# Patient Record
Sex: Male | Born: 1945 | Race: White | Hispanic: No | Marital: Married
Health system: Southern US, Community
[De-identification: ages and names within clinical notes are randomized; demographics above are authoritative.]

---

## 2013-06-26 ENCOUNTER — Other Ambulatory Visit (HOSPITAL_COMMUNITY): Payer: Self-pay

## 2013-06-26 ENCOUNTER — Inpatient Hospital Stay
Admission: AD | Admit: 2013-06-26 | Discharge: 2013-07-17 | Disposition: A | Discharge status: Skilled Nursing Facility | Payer: Self-pay | Source: Ambulatory Visit | Attending: Internal Medicine | Admitting: Internal Medicine

## 2013-06-27 ENCOUNTER — Other Ambulatory Visit (HOSPITAL_COMMUNITY): Payer: Self-pay

## 2013-06-27 LAB — URINALYSIS, ROUTINE W REFLEX MICROSCOPIC
Bilirubin Urine: NEGATIVE
Glucose, UA: NEGATIVE mg/dL
Ketones, ur: NEGATIVE mg/dL
Nitrite: NEGATIVE
Protein, ur: NEGATIVE mg/dL
Specific Gravity, Urine: 1.021 (ref 1.005–1.030)
UROBILINOGEN UA: 0.2 mg/dL (ref 0.0–1.0)
pH: 6.5 (ref 5.0–8.0)

## 2013-06-27 LAB — CBC WITH DIFFERENTIAL/PLATELET
BASOS PCT: 1 % (ref 0–1)
Basophils Absolute: 0 10*3/uL (ref 0.0–0.1)
Eosinophils Absolute: 0.3 10*3/uL (ref 0.0–0.7)
Eosinophils Relative: 6 % — ABNORMAL HIGH (ref 0–5)
HCT: 27.7 % — ABNORMAL LOW (ref 39.0–52.0)
Hemoglobin: 8.6 g/dL — ABNORMAL LOW (ref 13.0–17.0)
LYMPHS ABS: 1.1 10*3/uL (ref 0.7–4.0)
Lymphocytes Relative: 21 % (ref 12–46)
MCH: 29.7 pg (ref 26.0–34.0)
MCHC: 31 g/dL (ref 30.0–36.0)
MCV: 95.5 fL (ref 78.0–100.0)
Monocytes Absolute: 0.4 10*3/uL (ref 0.1–1.0)
Monocytes Relative: 7 % (ref 3–12)
NEUTROS PCT: 66 % (ref 43–77)
Neutro Abs: 3.5 10*3/uL (ref 1.7–7.7)
PLATELETS: 227 10*3/uL (ref 150–400)
RBC: 2.9 MIL/uL — AB (ref 4.22–5.81)
RDW: 15.3 % (ref 11.5–15.5)
WBC: 5.4 10*3/uL (ref 4.0–10.5)

## 2013-06-27 LAB — HEMOGLOBIN A1C
Hgb A1c MFr Bld: 5.2 % (ref <5.7)
MEAN PLASMA GLUCOSE: 103 mg/dL (ref <117)

## 2013-06-27 LAB — TSH: TSH: 0.995 u[IU]/mL (ref 0.350–4.500)

## 2013-06-27 LAB — COMPREHENSIVE METABOLIC PANEL
ALK PHOS: 111 U/L (ref 39–117)
ALT: 45 U/L (ref 0–53)
AST: 36 U/L (ref 0–37)
Albumin: 2.1 g/dL — ABNORMAL LOW (ref 3.5–5.2)
BUN: 31 mg/dL — ABNORMAL HIGH (ref 6–23)
CO2: 30 mEq/L (ref 19–32)
Calcium: 9.2 mg/dL (ref 8.4–10.5)
Chloride: 106 mEq/L (ref 96–112)
Creatinine, Ser: 0.64 mg/dL (ref 0.50–1.35)
GFR calc Af Amer: >90 mL/min (ref >90)
GFR calc non Af Amer: >90 mL/min (ref >90)
GLUCOSE: 98 mg/dL (ref 70–99)
POTASSIUM: 4.2 meq/L (ref 3.7–5.3)
SODIUM: 145 meq/L (ref 137–147)
TOTAL PROTEIN: 7.1 g/dL (ref 6.0–8.3)
Total Bilirubin: 0.5 mg/dL (ref 0.3–1.2)

## 2013-06-27 LAB — URINE MICROSCOPIC-ADD ON

## 2013-06-27 LAB — PROTIME-INR
INR: 1.26 (ref 0.00–1.49)
Prothrombin Time: 15.5 seconds — ABNORMAL HIGH (ref 11.6–15.2)

## 2013-06-27 LAB — LIPID PANEL
CHOL/HDL RATIO: 4.8 ratio
Cholesterol: 100 mg/dL (ref 0–200)
HDL: 21 mg/dL — ABNORMAL LOW (ref >39)
LDL Cholesterol: 66 mg/dL (ref 0–99)
Triglycerides: 63 mg/dL (ref <150)
VLDL: 13 mg/dL (ref 0–40)

## 2013-06-27 LAB — MAGNESIUM: Magnesium: 2.1 mg/dL (ref 1.5–2.5)

## 2013-06-27 LAB — PROCALCITONIN: PROCALCITONIN: 0.13 ng/mL

## 2013-06-27 LAB — APTT: APTT: 50 s — AB (ref 24–37)

## 2013-06-27 LAB — T4, FREE: Free T4: 0.69 ng/dL — ABNORMAL LOW (ref 0.80–1.80)

## 2013-06-27 LAB — PHOSPHORUS: Phosphorus: 4.9 mg/dL — ABNORMAL HIGH (ref 2.3–4.6)

## 2013-06-27 LAB — PREALBUMIN: PREALBUMIN: 5.8 mg/dL — AB (ref 17.0–34.0)

## 2013-06-27 MED ORDER — IOHEXOL 300 MG/ML  SOLN
50.0000 mL | Freq: Once | INTRAMUSCULAR | Status: AC | PRN
  Administered 2013-06-27: 50 mL via ORAL

## 2013-06-28 LAB — CBC
HCT: 24.6 % — ABNORMAL LOW (ref 39.0–52.0)
HEMOGLOBIN: 7.7 g/dL — AB (ref 13.0–17.0)
MCH: 30.1 pg (ref 26.0–34.0)
MCHC: 31.3 g/dL (ref 30.0–36.0)
MCV: 96.1 fL (ref 78.0–100.0)
Platelets: 241 10*3/uL (ref 150–400)
RBC: 2.56 MIL/uL — AB (ref 4.22–5.81)
RDW: 15.4 % (ref 11.5–15.5)
WBC: 6.5 10*3/uL (ref 4.0–10.5)

## 2013-06-28 LAB — BASIC METABOLIC PANEL
BUN: 33 mg/dL — ABNORMAL HIGH (ref 6–23)
CO2: 29 meq/L (ref 19–32)
Calcium: 8.7 mg/dL (ref 8.4–10.5)
Chloride: 108 mEq/L (ref 96–112)
Creatinine, Ser: 0.6 mg/dL (ref 0.50–1.35)
GFR calc Af Amer: >90 mL/min (ref >90)
GFR calc non Af Amer: >90 mL/min (ref >90)
GLUCOSE: 112 mg/dL — AB (ref 70–99)
Potassium: 4.3 mEq/L (ref 3.7–5.3)
Sodium: 144 mEq/L (ref 137–147)

## 2013-06-28 LAB — PROTIME-INR
INR: 1.15 (ref 0.00–1.49)
Prothrombin Time: 14.5 seconds (ref 11.6–15.2)

## 2013-06-29 LAB — BASIC METABOLIC PANEL
BUN: 30 mg/dL — ABNORMAL HIGH (ref 6–23)
CHLORIDE: 105 meq/L (ref 96–112)
CO2: 29 mEq/L (ref 19–32)
Calcium: 8.9 mg/dL (ref 8.4–10.5)
Creatinine, Ser: 0.62 mg/dL (ref 0.50–1.35)
GFR calc Af Amer: >90 mL/min (ref >90)
GFR calc non Af Amer: >90 mL/min (ref >90)
Glucose, Bld: 116 mg/dL — ABNORMAL HIGH (ref 70–99)
POTASSIUM: 4.2 meq/L (ref 3.7–5.3)
Sodium: 142 mEq/L (ref 137–147)

## 2013-06-29 LAB — CBC WITH DIFFERENTIAL/PLATELET
BASOS ABS: 0 10*3/uL (ref 0.0–0.1)
Basophils Relative: 0 % (ref 0–1)
Eosinophils Absolute: 0.4 10*3/uL (ref 0.0–0.7)
Eosinophils Relative: 7 % — ABNORMAL HIGH (ref 0–5)
HEMATOCRIT: 25.1 % — AB (ref 39.0–52.0)
HEMOGLOBIN: 8 g/dL — AB (ref 13.0–17.0)
LYMPHS PCT: 19 % (ref 12–46)
Lymphs Abs: 1.2 10*3/uL (ref 0.7–4.0)
MCH: 30.5 pg (ref 26.0–34.0)
MCHC: 31.9 g/dL (ref 30.0–36.0)
MCV: 95.8 fL (ref 78.0–100.0)
MONOS PCT: 6 % (ref 3–12)
Monocytes Absolute: 0.4 10*3/uL (ref 0.1–1.0)
NEUTROS ABS: 4.2 10*3/uL (ref 1.7–7.7)
Neutrophils Relative %: 67 % (ref 43–77)
Platelets: 238 10*3/uL (ref 150–400)
RBC: 2.62 MIL/uL — ABNORMAL LOW (ref 4.22–5.81)
RDW: 15.5 % (ref 11.5–15.5)
WBC: 6.2 10*3/uL (ref 4.0–10.5)

## 2013-06-29 LAB — URINE CULTURE: Colony Count: >=100000

## 2013-06-29 LAB — PROTIME-INR
INR: 1.16 (ref 0.00–1.49)
PROTHROMBIN TIME: 14.6 s (ref 11.6–15.2)

## 2013-06-30 ENCOUNTER — Other Ambulatory Visit (HOSPITAL_COMMUNITY): Payer: Self-pay

## 2013-06-30 LAB — PROTIME-INR
INR: 1.16 (ref 0.00–1.49)
PROTHROMBIN TIME: 14.6 s (ref 11.6–15.2)

## 2013-07-01 LAB — CBC
HCT: 25.3 % — ABNORMAL LOW (ref 39.0–52.0)
Hemoglobin: 8.1 g/dL — ABNORMAL LOW (ref 13.0–17.0)
MCH: 30.7 pg (ref 26.0–34.0)
MCHC: 32 g/dL (ref 30.0–36.0)
MCV: 95.8 fL (ref 78.0–100.0)
PLATELETS: 254 10*3/uL (ref 150–400)
RBC: 2.64 MIL/uL — ABNORMAL LOW (ref 4.22–5.81)
RDW: 16.4 % — AB (ref 11.5–15.5)
WBC: 6.6 10*3/uL (ref 4.0–10.5)

## 2013-07-01 LAB — BASIC METABOLIC PANEL
BUN: 29 mg/dL — ABNORMAL HIGH (ref 6–23)
CHLORIDE: 101 meq/L (ref 96–112)
CO2: 29 mEq/L (ref 19–32)
Calcium: 8.8 mg/dL (ref 8.4–10.5)
Creatinine, Ser: 0.62 mg/dL (ref 0.50–1.35)
Glucose, Bld: 113 mg/dL — ABNORMAL HIGH (ref 70–99)
POTASSIUM: 4.4 meq/L (ref 3.7–5.3)
SODIUM: 138 meq/L (ref 137–147)

## 2013-07-01 LAB — PROTIME-INR
INR: 1.2 (ref 0.00–1.49)
PROTHROMBIN TIME: 14.9 s (ref 11.6–15.2)

## 2013-07-01 LAB — LEVETIRACETAM LEVEL: Levetiracetam Lvl: 15.7 ug/mL

## 2013-07-02 DIAGNOSIS — M7989 Other specified soft tissue disorders: Secondary | ICD-10-CM

## 2013-07-02 LAB — PROTIME-INR
INR: 1.15 (ref 0.00–1.49)
PROTHROMBIN TIME: 14.5 s (ref 11.6–15.2)

## 2013-07-02 NOTE — Progress Notes (Signed)
VASCULAR LAB PRELIMINARY  PRELIMINARY  PRELIMINARY  PRELIMINARY  Left upper extremity venous duplex completed.    Preliminary report:  Left:  No evidence of DVT or superficial thrombosis.    Brayson Livesey, RVT 07/02/2013, 8:24 AM

## 2013-07-03 LAB — PROTIME-INR
INR: 1.08 (ref 0.00–1.49)
Prothrombin Time: 13.8 seconds (ref 11.6–15.2)

## 2013-07-04 LAB — PROTIME-INR
INR: 1.13 (ref 0.00–1.49)
Prothrombin Time: 14.3 seconds (ref 11.6–15.2)

## 2013-07-05 LAB — PROTIME-INR
INR: 1.05 (ref 0.00–1.49)
Prothrombin Time: 13.5 seconds (ref 11.6–15.2)

## 2013-07-06 LAB — COMPREHENSIVE METABOLIC PANEL
ALK PHOS: 86 U/L (ref 39–117)
ALT: 27 U/L (ref 0–53)
AST: 32 U/L (ref 0–37)
Albumin: 2.1 g/dL — ABNORMAL LOW (ref 3.5–5.2)
BUN: 22 mg/dL (ref 6–23)
CO2: 29 mEq/L (ref 19–32)
Calcium: 8.5 mg/dL (ref 8.4–10.5)
Chloride: 98 mEq/L (ref 96–112)
Creatinine, Ser: 0.57 mg/dL (ref 0.50–1.35)
GFR calc non Af Amer: >90 mL/min (ref >90)
Glucose, Bld: 108 mg/dL — ABNORMAL HIGH (ref 70–99)
Potassium: 4.2 mEq/L (ref 3.7–5.3)
Sodium: 135 mEq/L — ABNORMAL LOW (ref 137–147)
TOTAL PROTEIN: 6.7 g/dL (ref 6.0–8.3)
Total Bilirubin: 0.3 mg/dL (ref 0.3–1.2)

## 2013-07-06 LAB — CBC WITH DIFFERENTIAL/PLATELET
BASOS PCT: 0 % (ref 0–1)
Basophils Absolute: 0 10*3/uL (ref 0.0–0.1)
EOS ABS: 0.3 10*3/uL (ref 0.0–0.7)
Eosinophils Relative: 6 % — ABNORMAL HIGH (ref 0–5)
HCT: 27.1 % — ABNORMAL LOW (ref 39.0–52.0)
Hemoglobin: 8.7 g/dL — ABNORMAL LOW (ref 13.0–17.0)
LYMPHS ABS: 1.2 10*3/uL (ref 0.7–4.0)
Lymphocytes Relative: 26 % (ref 12–46)
MCH: 30.9 pg (ref 26.0–34.0)
MCHC: 32.1 g/dL (ref 30.0–36.0)
MCV: 96.1 fL (ref 78.0–100.0)
MONO ABS: 0.3 10*3/uL (ref 0.1–1.0)
Monocytes Relative: 6 % (ref 3–12)
NEUTROS PCT: 61 % (ref 43–77)
Neutro Abs: 2.8 10*3/uL (ref 1.7–7.7)
Platelets: 261 10*3/uL (ref 150–400)
RBC: 2.82 MIL/uL — ABNORMAL LOW (ref 4.22–5.81)
RDW: 18.2 % — ABNORMAL HIGH (ref 11.5–15.5)
WBC: 4.5 10*3/uL (ref 4.0–10.5)

## 2013-07-06 LAB — PROTIME-INR
INR: 1.17 (ref 0.00–1.49)
PROTHROMBIN TIME: 14.7 s (ref 11.6–15.2)

## 2013-07-07 LAB — PROTIME-INR
INR: 1.21 (ref 0.00–1.49)
PROTHROMBIN TIME: 15 s (ref 11.6–15.2)

## 2013-07-08 ENCOUNTER — Other Ambulatory Visit (HOSPITAL_COMMUNITY): Payer: Self-pay

## 2013-07-08 LAB — CK TOTAL AND CKMB (NOT AT ARMC)
CK, MB: 1.4 ng/mL (ref 0.3–4.0)
RELATIVE INDEX: INVALID (ref 0.0–2.5)
Total CK: 18 U/L (ref 7–232)

## 2013-07-08 LAB — PROTIME-INR
INR: 1.26 (ref 0.00–1.49)
PROTHROMBIN TIME: 15.5 s — AB (ref 11.6–15.2)

## 2013-07-08 LAB — PREALBUMIN: PREALBUMIN: 8 mg/dL — AB (ref 17.0–34.0)

## 2013-07-08 LAB — TROPONIN I

## 2013-07-09 LAB — BASIC METABOLIC PANEL
BUN: 18 mg/dL (ref 6–23)
CHLORIDE: 97 meq/L (ref 96–112)
CO2: 27 mEq/L (ref 19–32)
Calcium: 8.6 mg/dL (ref 8.4–10.5)
Creatinine, Ser: 0.48 mg/dL — ABNORMAL LOW (ref 0.50–1.35)
GFR calc non Af Amer: >90 mL/min (ref >90)
Glucose, Bld: 101 mg/dL — ABNORMAL HIGH (ref 70–99)
Potassium: 4.3 mEq/L (ref 3.7–5.3)
SODIUM: 133 meq/L — AB (ref 137–147)

## 2013-07-09 LAB — PROTIME-INR
INR: 1.43 (ref 0.00–1.49)
Prothrombin Time: 17.1 seconds — ABNORMAL HIGH (ref 11.6–15.2)

## 2013-07-09 LAB — CBC
HCT: 21 % — ABNORMAL LOW (ref 39.0–52.0)
HCT: 25.1 % — ABNORMAL LOW (ref 39.0–52.0)
Hemoglobin: 7 g/dL — ABNORMAL LOW (ref 13.0–17.0)
Hemoglobin: 8.1 g/dL — ABNORMAL LOW (ref 13.0–17.0)
MCH: 30.9 pg (ref 26.0–34.0)
MCH: 30.1 pg (ref 26.0–34.0)
MCHC: 32.9 g/dL (ref 30.0–36.0)
MCHC: 32.3 g/dL (ref 30.0–36.0)
MCV: 94.2 fL (ref 78.0–100.0)
MCV: 93.3 fL (ref 78.0–100.0)
PLATELETS: 236 10*3/uL (ref 150–400)
PLATELETS: 307 10*3/uL (ref 150–400)
RBC: 2.23 MIL/uL — AB (ref 4.22–5.81)
RBC: 2.69 MIL/uL — AB (ref 4.22–5.81)
RDW: 18.3 % — AB (ref 11.5–15.5)
RDW: 18.2 % — ABNORMAL HIGH (ref 11.5–15.5)
WBC: 3.7 10*3/uL — AB (ref 4.0–10.5)
WBC: 4.7 10*3/uL (ref 4.0–10.5)

## 2013-07-09 LAB — CK TOTAL AND CKMB (NOT AT ARMC)
CK, MB: 1.4 ng/mL (ref 0.3–4.0)
RELATIVE INDEX: INVALID (ref 0.0–2.5)
Total CK: 15 U/L (ref 7–232)

## 2013-07-10 LAB — CBC WITH DIFFERENTIAL/PLATELET
Basophils Absolute: 0 10*3/uL (ref 0.0–0.1)
Basophils Relative: 0 % (ref 0–1)
Eosinophils Absolute: 0.2 10*3/uL (ref 0.0–0.7)
Eosinophils Relative: 5 % (ref 0–5)
HEMATOCRIT: 23.2 % — AB (ref 39.0–52.0)
HEMOGLOBIN: 7.5 g/dL — AB (ref 13.0–17.0)
LYMPHS ABS: 1.1 10*3/uL (ref 0.7–4.0)
LYMPHS PCT: 30 % (ref 12–46)
MCH: 30.5 pg (ref 26.0–34.0)
MCHC: 32.3 g/dL (ref 30.0–36.0)
MCV: 94.3 fL (ref 78.0–100.0)
MONOS PCT: 8 % (ref 3–12)
Monocytes Absolute: 0.3 10*3/uL (ref 0.1–1.0)
NEUTROS ABS: 2.1 10*3/uL (ref 1.7–7.7)
NEUTROS PCT: 58 % (ref 43–77)
Platelets: 290 10*3/uL (ref 150–400)
RBC: 2.46 MIL/uL — AB (ref 4.22–5.81)
RDW: 18.2 % — ABNORMAL HIGH (ref 11.5–15.5)
WBC: 3.7 10*3/uL — ABNORMAL LOW (ref 4.0–10.5)

## 2013-07-10 LAB — BASIC METABOLIC PANEL
BUN: 17 mg/dL (ref 6–23)
CO2: 28 meq/L (ref 19–32)
Calcium: 8.7 mg/dL (ref 8.4–10.5)
Chloride: 96 mEq/L (ref 96–112)
Creatinine, Ser: 0.51 mg/dL (ref 0.50–1.35)
GFR calc non Af Amer: >90 mL/min (ref >90)
GLUCOSE: 104 mg/dL — AB (ref 70–99)
POTASSIUM: 4.4 meq/L (ref 3.7–5.3)
Sodium: 135 mEq/L — ABNORMAL LOW (ref 137–147)

## 2013-07-10 LAB — PROTIME-INR
INR: 1.7 — ABNORMAL HIGH (ref 0.00–1.49)
Prothrombin Time: 19.5 seconds — ABNORMAL HIGH (ref 11.6–15.2)

## 2013-07-11 LAB — PROTIME-INR
INR: 1.82 — ABNORMAL HIGH (ref 0.00–1.49)
Prothrombin Time: 20.5 seconds — ABNORMAL HIGH (ref 11.6–15.2)

## 2013-07-12 LAB — PROTIME-INR
INR: 1.8 — AB (ref 0.00–1.49)
Prothrombin Time: 20.4 seconds — ABNORMAL HIGH (ref 11.6–15.2)

## 2013-07-13 LAB — CBC WITH DIFFERENTIAL/PLATELET
BASOS PCT: 1 % (ref 0–1)
Basophils Absolute: 0 10*3/uL (ref 0.0–0.1)
EOS ABS: 0.2 10*3/uL (ref 0.0–0.7)
Eosinophils Relative: 4 % (ref 0–5)
HCT: 25.4 % — ABNORMAL LOW (ref 39.0–52.0)
HEMOGLOBIN: 8.2 g/dL — AB (ref 13.0–17.0)
Lymphocytes Relative: 31 % (ref 12–46)
Lymphs Abs: 1.3 10*3/uL (ref 0.7–4.0)
MCH: 31.1 pg (ref 26.0–34.0)
MCHC: 32.3 g/dL (ref 30.0–36.0)
MCV: 96.2 fL (ref 78.0–100.0)
MONO ABS: 0.4 10*3/uL (ref 0.1–1.0)
Monocytes Relative: 9 % (ref 3–12)
NEUTROS PCT: 55 % (ref 43–77)
Neutro Abs: 2.4 10*3/uL (ref 1.7–7.7)
Platelets: 280 10*3/uL (ref 150–400)
RBC: 2.64 MIL/uL — ABNORMAL LOW (ref 4.22–5.81)
RDW: 18.5 % — ABNORMAL HIGH (ref 11.5–15.5)
WBC: 4.3 10*3/uL (ref 4.0–10.5)

## 2013-07-13 LAB — BASIC METABOLIC PANEL
BUN: 23 mg/dL (ref 6–23)
CO2: 28 mEq/L (ref 19–32)
CREATININE: 0.62 mg/dL (ref 0.50–1.35)
Calcium: 9 mg/dL (ref 8.4–10.5)
Chloride: 96 mEq/L (ref 96–112)
Glucose, Bld: 105 mg/dL — ABNORMAL HIGH (ref 70–99)
POTASSIUM: 4.5 meq/L (ref 3.7–5.3)
Sodium: 135 mEq/L — ABNORMAL LOW (ref 137–147)

## 2013-07-13 LAB — PROTIME-INR
INR: 2.12 — ABNORMAL HIGH (ref 0.00–1.49)
Prothrombin Time: 23.1 seconds — ABNORMAL HIGH (ref 11.6–15.2)

## 2013-07-13 LAB — MAGNESIUM: Magnesium: 2 mg/dL (ref 1.5–2.5)

## 2013-07-13 LAB — PREALBUMIN: PREALBUMIN: 5.4 mg/dL — AB (ref 17.0–34.0)

## 2013-07-13 LAB — PHOSPHORUS: Phosphorus: 4.7 mg/dL — ABNORMAL HIGH (ref 2.3–4.6)

## 2013-07-14 LAB — PROTIME-INR
INR: 2.04 — ABNORMAL HIGH (ref 0.00–1.49)
PROTHROMBIN TIME: 22.4 s — AB (ref 11.6–15.2)

## 2013-07-15 LAB — BASIC METABOLIC PANEL
BUN: 26 mg/dL — AB (ref 6–23)
CHLORIDE: 97 meq/L (ref 96–112)
CO2: 29 mEq/L (ref 19–32)
CREATININE: 0.53 mg/dL (ref 0.50–1.35)
Calcium: 8.6 mg/dL (ref 8.4–10.5)
Glucose, Bld: 106 mg/dL — ABNORMAL HIGH (ref 70–99)
POTASSIUM: 4.2 meq/L (ref 3.7–5.3)
Sodium: 137 mEq/L (ref 137–147)

## 2013-07-15 LAB — PROTIME-INR
INR: 2.37 — ABNORMAL HIGH (ref 0.00–1.49)
Prothrombin Time: 25.1 seconds — ABNORMAL HIGH (ref 11.6–15.2)

## 2013-07-15 LAB — CBC
HEMATOCRIT: 25.1 % — AB (ref 39.0–52.0)
Hemoglobin: 7.8 g/dL — ABNORMAL LOW (ref 13.0–17.0)
MCH: 30.2 pg (ref 26.0–34.0)
MCHC: 31.1 g/dL (ref 30.0–36.0)
MCV: 97.3 fL (ref 78.0–100.0)
PLATELETS: 244 10*3/uL (ref 150–400)
RBC: 2.58 MIL/uL — ABNORMAL LOW (ref 4.22–5.81)
RDW: 18.4 % — ABNORMAL HIGH (ref 11.5–15.5)
WBC: 4.5 10*3/uL (ref 4.0–10.5)

## 2013-07-15 LAB — ABO/RH: ABO/RH(D): O POS

## 2013-07-15 LAB — PREPARE RBC (CROSSMATCH)

## 2013-07-16 LAB — CBC
HEMATOCRIT: 28.7 % — AB (ref 39.0–52.0)
HEMOGLOBIN: 8.9 g/dL — AB (ref 13.0–17.0)
MCH: 29.9 pg (ref 26.0–34.0)
MCHC: 31 g/dL (ref 30.0–36.0)
MCV: 96.3 fL (ref 78.0–100.0)
Platelets: 223 10*3/uL (ref 150–400)
RBC: 2.98 MIL/uL — AB (ref 4.22–5.81)
RDW: 19 % — AB (ref 11.5–15.5)
WBC: 4.6 10*3/uL (ref 4.0–10.5)

## 2013-07-16 LAB — BASIC METABOLIC PANEL
BUN: 29 mg/dL — ABNORMAL HIGH (ref 6–23)
CHLORIDE: 100 meq/L (ref 96–112)
CO2: 29 meq/L (ref 19–32)
Calcium: 8.6 mg/dL (ref 8.4–10.5)
Creatinine, Ser: 0.55 mg/dL (ref 0.50–1.35)
GFR calc non Af Amer: >90 mL/min (ref >90)
Glucose, Bld: 95 mg/dL (ref 70–99)
POTASSIUM: 4.3 meq/L (ref 3.7–5.3)
SODIUM: 140 meq/L (ref 137–147)

## 2013-07-16 LAB — PROTIME-INR
INR: 2.83 — AB (ref 0.00–1.49)
Prothrombin Time: 28.8 seconds — ABNORMAL HIGH (ref 11.6–15.2)

## 2013-07-17 LAB — PROTIME-INR
INR: 3.29 — AB (ref 0.00–1.49)
Prothrombin Time: 32.3 seconds — ABNORMAL HIGH (ref 11.6–15.2)

## 2013-07-19 LAB — TYPE AND SCREEN
ABO/RH(D): O POS
Antibody Screen: NEGATIVE
UNIT DIVISION: 0
Unit division: 0

## 2014-01-14 DEATH — deceased
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
## 2015-12-14 IMAGING — CR DG ABD PORTABLE 1V
2 series · 2 of 2 positions shown · non-contrast
Comparison: DG ABD PORTABLE 1V dated 06/27/2013; DG CHEST 1V PORT
dated 06/27/2013

CLINICAL DATA: Abdominal pain.

EXAM:
PORTABLE ABDOMEN - 1 VIEW

[AP (1 of 2)]
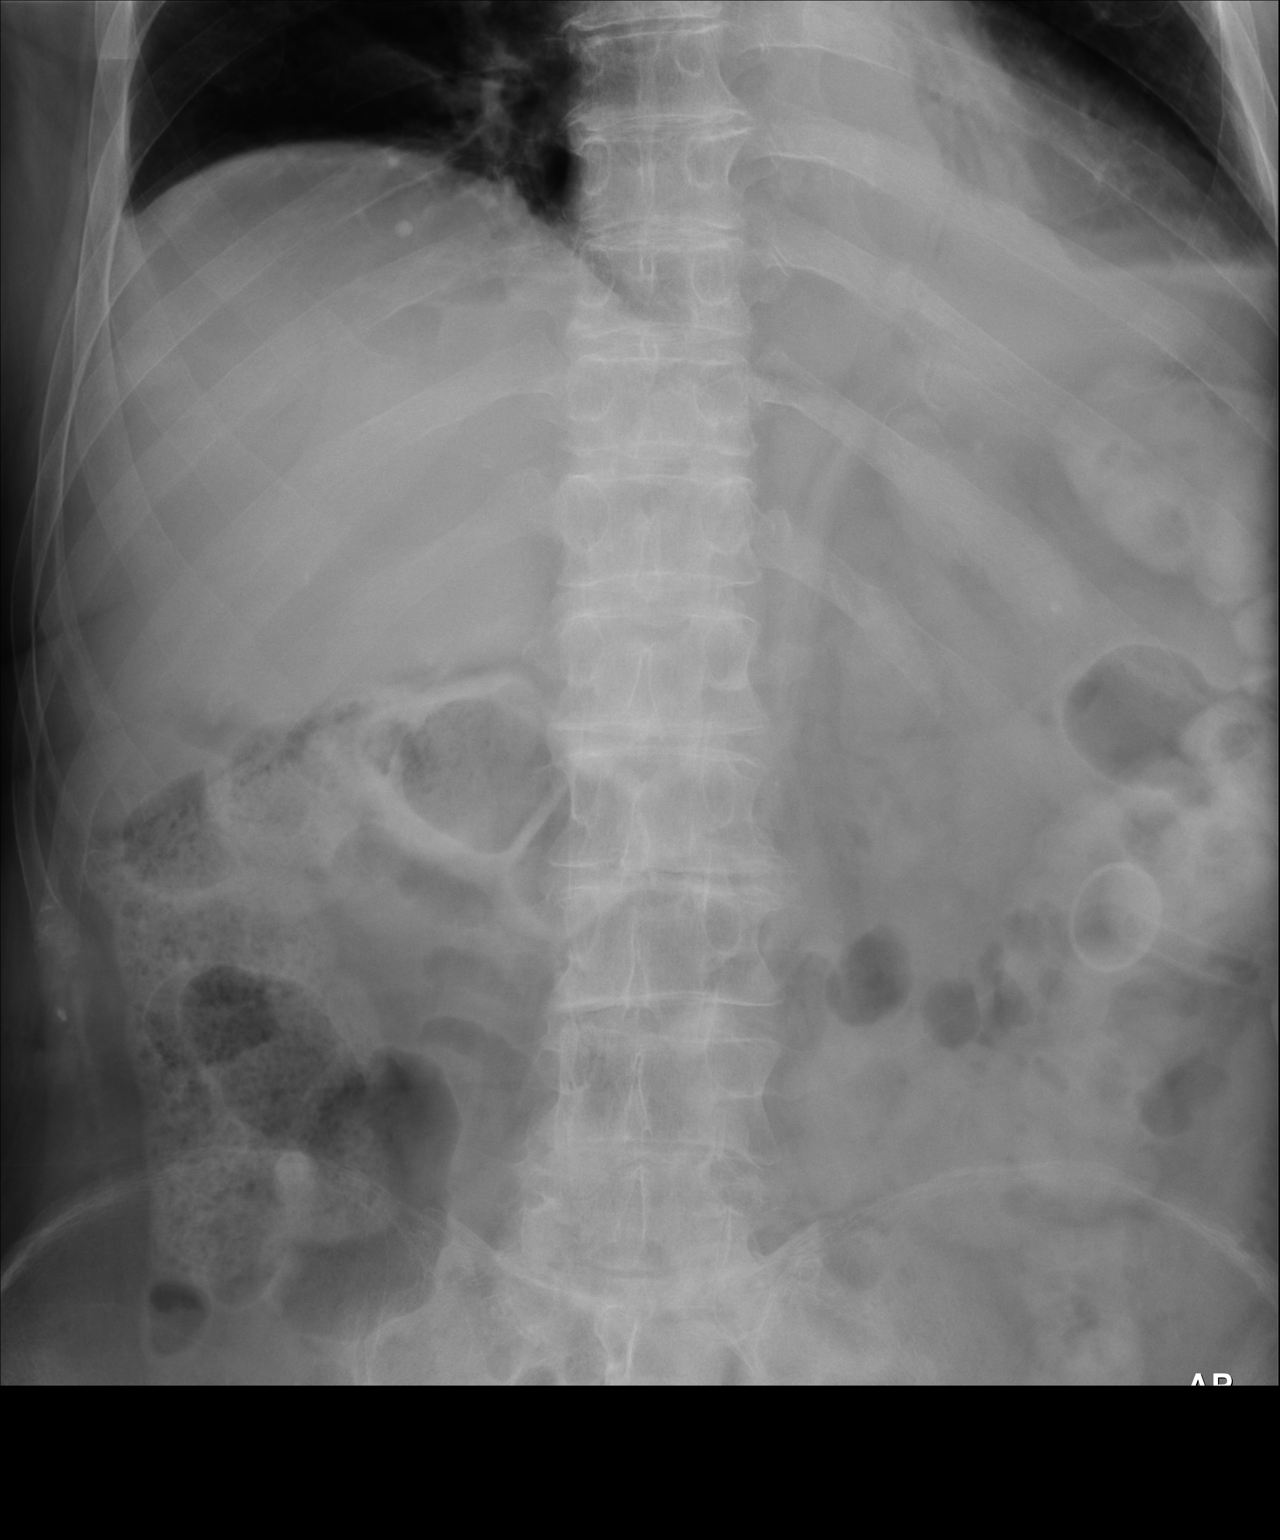

[AP (2 of 2)]
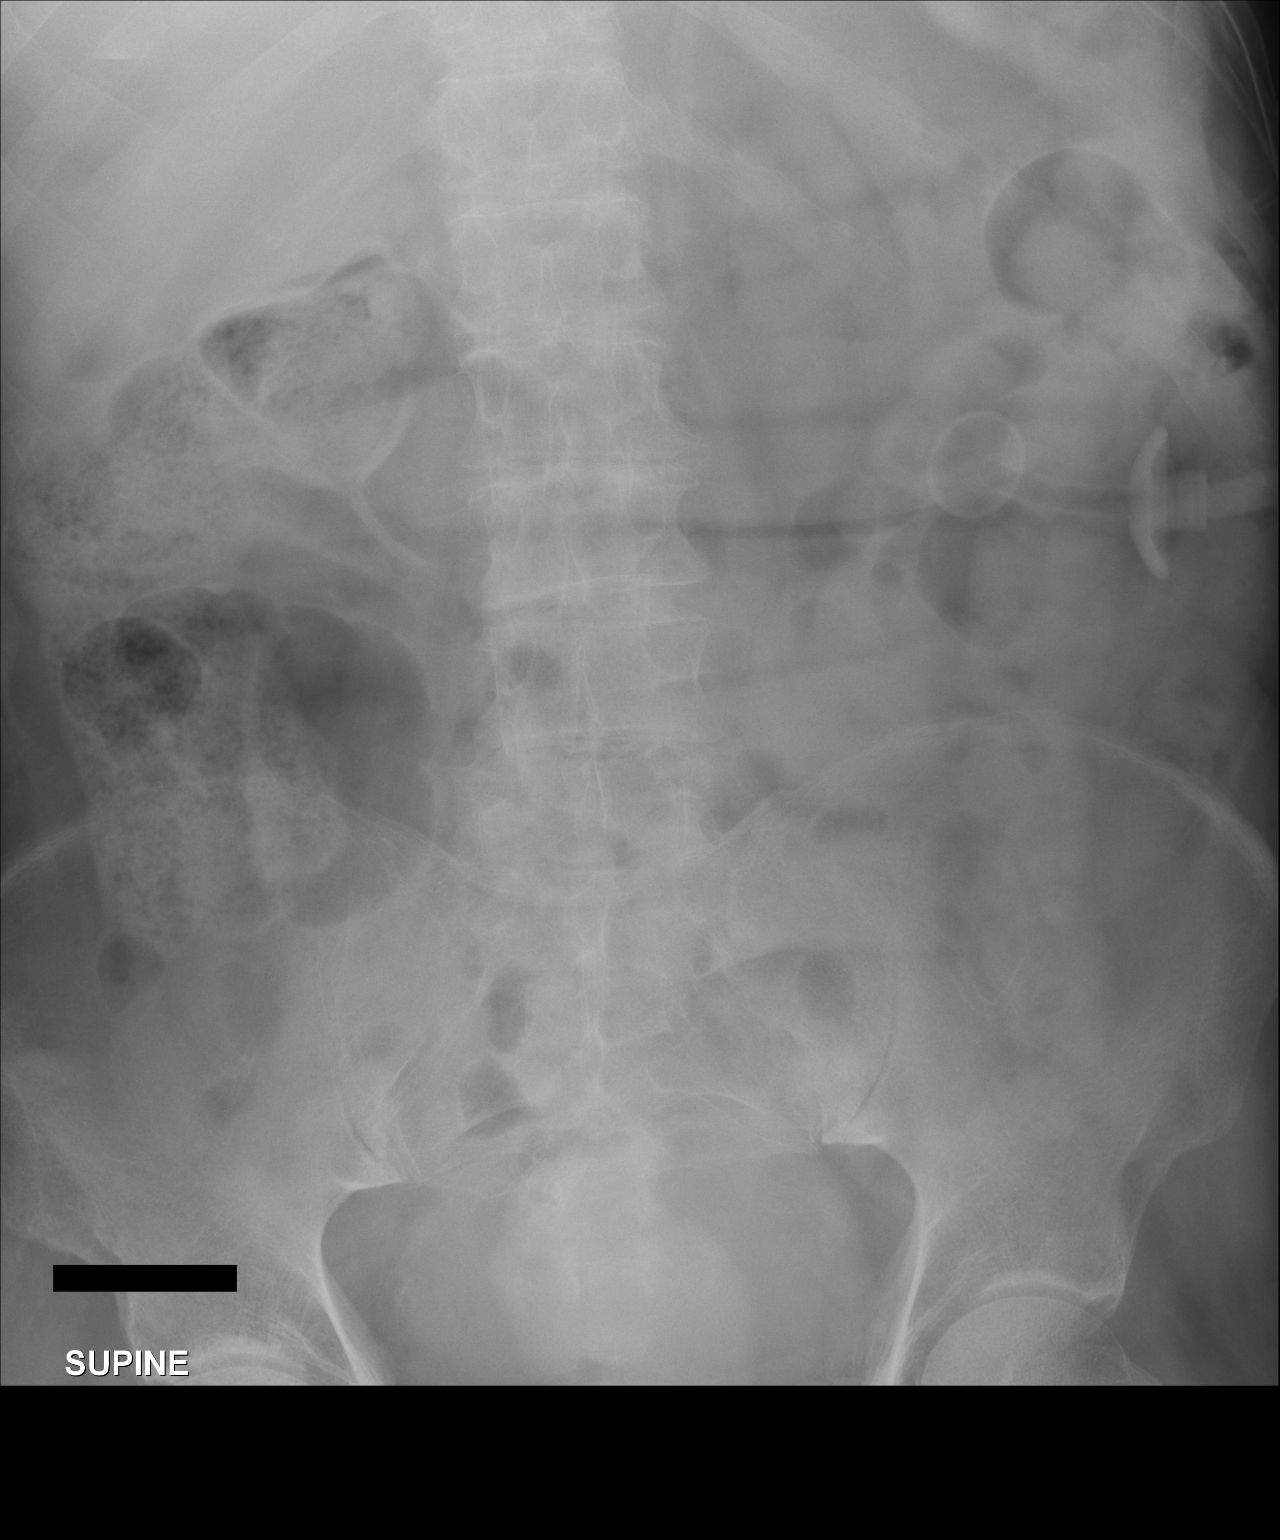

[2 of 2 positions shown; findings below may reference images not displayed]

FINDINGS: Two supine views. Peg tube projects over the left side of the
abdomen. No free intraperitoneal air. Right hemidiaphragm elevation.
Suspect trace left pleural fluid with adjacent airspace disease.
Suboptimally evaluated.

Moderate ascending colonic stool. Non-obstructive bowel gas pattern.
No abnormal abdominal calcifications. No appendicolith.
IMPRESSION: No acute findings.

Possible constipation.

Probable left pleural effusion and adjacent airspace disease.
